# Patient Record
Sex: Female | Born: 1950 | Hispanic: No | Smoking: Current every day smoker
Health system: Southern US, Community
[De-identification: ages and names within clinical notes are randomized; demographics above are authoritative.]

---

## 2004-07-29 ENCOUNTER — Ambulatory Visit: Payer: Self-pay | Admitting: Obstetrics and Gynecology

## 2004-07-29 ENCOUNTER — Other Ambulatory Visit: Admission: RE | Admit: 2004-07-29 | Discharge: 2004-07-29 | Payer: Self-pay | Admitting: Obstetrics and Gynecology

## 2004-08-12 ENCOUNTER — Ambulatory Visit: Payer: Self-pay | Admitting: Obstetrics and Gynecology

## 2004-10-07 ENCOUNTER — Ambulatory Visit: Payer: Self-pay | Admitting: Obstetrics & Gynecology

## 2004-10-07 ENCOUNTER — Other Ambulatory Visit: Admission: RE | Admit: 2004-10-07 | Discharge: 2004-10-07 | Payer: Self-pay | Admitting: Obstetrics & Gynecology

## 2004-10-21 ENCOUNTER — Ambulatory Visit: Payer: Self-pay | Admitting: Obstetrics & Gynecology

## 2005-02-24 ENCOUNTER — Ambulatory Visit: Payer: Self-pay | Admitting: Obstetrics and Gynecology

## 2005-08-11 ENCOUNTER — Ambulatory Visit: Payer: Self-pay | Admitting: Obstetrics & Gynecology

## 2005-12-21 ENCOUNTER — Ambulatory Visit (HOSPITAL_COMMUNITY): Admission: RE | Admit: 2005-12-21 | Discharge: 2005-12-21 | Payer: Self-pay | Admitting: Obstetrics and Gynecology

## 2016-01-23 ENCOUNTER — Encounter (HOSPITAL_BASED_OUTPATIENT_CLINIC_OR_DEPARTMENT_OTHER): Payer: Self-pay | Admitting: *Deleted

## 2016-01-23 ENCOUNTER — Emergency Department (HOSPITAL_BASED_OUTPATIENT_CLINIC_OR_DEPARTMENT_OTHER): Payer: BLUE CROSS/BLUE SHIELD

## 2016-01-23 ENCOUNTER — Emergency Department (HOSPITAL_BASED_OUTPATIENT_CLINIC_OR_DEPARTMENT_OTHER)
Admission: EM | Admit: 2016-01-23 | Discharge: 2016-01-23 | Disposition: A | Discharge status: Home / Self Care | Payer: BLUE CROSS/BLUE SHIELD | Attending: Emergency Medicine | Admitting: Emergency Medicine

## 2016-01-23 DIAGNOSIS — J069 Acute upper respiratory infection, unspecified: Secondary | ICD-10-CM | POA: Insufficient documentation

## 2016-01-23 DIAGNOSIS — F17200 Nicotine dependence, unspecified, uncomplicated: Secondary | ICD-10-CM | POA: Insufficient documentation

## 2016-01-23 DIAGNOSIS — H9201 Otalgia, right ear: Secondary | ICD-10-CM | POA: Diagnosis present

## 2016-01-23 MED ORDER — ALBUTEROL SULFATE HFA 108 (90 BASE) MCG/ACT IN AERS: 1.0000 | INHALATION_SPRAY | Freq: Four times a day (QID) | RESPIRATORY_TRACT | 0 refills | Status: AC | PRN

## 2016-01-23 NOTE — ED Notes (Signed)
EDPA at [facility], pt alert, NAD, calm, interactive, resps e/u, speaking in clear complete sentences, pt seen by EDPA prior to RN assessment, see PA notes, pending orders.

## 2016-01-23 NOTE — Discharge Instructions (Signed)
Please read and follow all provided instructions.  Your diagnoses today include:  1. URI (upper respiratory infection)    Tests performed today include: Vital signs. See below for your results today.   Medications prescribed:  Take as prescribed   Home care instructions:  Follow any educational materials contained in this packet.  Follow-up instructions: Please follow-up with your primary care provider for further evaluation of symptoms and treatment   Return instructions:  Please return to the Emergency Department if you do not get better, if you get worse, or new symptoms OR  - Fever (temperature greater than 101.68F)  - Bleeding that does not stop with holding pressure to the area    -Severe pain (please note that you may be more sore the day after your accident)  - Chest Pain  - Difficulty breathing  - Severe nausea or vomiting  - Inability to tolerate food and liquids  - Passing out  - Skin becoming red around your wounds  - Change in mental status (confusion or lethargy)  - New numbness or weakness    Please return if you have any other emergent concerns.  Additional Information:  Your vital signs today were: BP 136/81 (BP Location: Left Arm)    Pulse 92    Temp 98.2 F (36.8 C) (Oral)    Resp 18    Ht 5\' 6"  (1.676 m)    Wt 61.5 kg    SpO2 95%    BMI 21.89 kg/m  If your blood pressure (BP) was elevated above 135/85 this visit, please have this repeated by your doctor within one month. ---------------

## 2016-01-23 NOTE — ED Provider Notes (Signed)
[facility] Note   CSN: [id number] Arrival date & time: 01/23/16  1805  By signing my name below, I, Phillis Haggis, attest that this documentation has been prepared under the direction and in the presence of Audry Pili, PA-C. Electronically Signed: Phillis Haggis, ED Scribe. 01/23/16. 8:14 PM.  First Provider Contact:  First MD Initiated Contact with Patient 01/23/16 1935     History   Chief Complaint Chief Complaint  Patient presents with  . Otalgia   The history is provided by the patient. No language interpreter was used.  HPI Comments: Sierra Kelley is a 65 y.o. female who presents to the Emergency Department complaining of sharp right otalgia onset 3 days ago. She reports an associated "knot" behind the ear, productive cough with sputum, and rhinorrhea. Pt states that she has had a sinus infection for the past 2 weeks and has been using OTC medications including Mucinex DM and Sudafed to no relief. Pt is a smoker. She denies fever, chills, sinus pressure, or ear discharge. No CP/SOB. No other symptoms noted.    History reviewed. No pertinent past medical history.  There are no active problems to display for this patient.  History reviewed. No pertinent surgical history.  OB History    No data available     Home Medications    Prior to Admission medications   Not on File   Family History No family history on file.  Social History Social History  Substance Use Topics  . Smoking status: Current Every Day Smoker  . Smokeless tobacco: Never Used  . Alcohol use No    Allergies   Review of patient's allergies indicates no known allergies.   Review of Systems Review of Systems  Constitutional: Negative for chills and fever.  HENT: Positive for ear pain and rhinorrhea. Negative for ear discharge and sinus pressure.   Respiratory: Positive for cough and shortness of breath.    Physical Exam Updated Vital Signs BP 136/81 (BP Location: Left  Arm)   Pulse 92   Temp 98.2 F (36.8 C) (Oral)   Resp 18   Ht  (1.676 m)   Wt 135 lb 9.6 oz (61.5 kg)   SpO2 95%   BMI 21.89 kg/m   Physical Exam  Constitutional: She is oriented to person, place, and time. She appears well-developed and well-nourished.  HENT:  Head: Normocephalic and atraumatic.  Right Ear: Tympanic membrane normal. No mastoid tenderness.  Left Ear: Tympanic membrane normal. No mastoid tenderness.  Mouth/Throat: Uvula is midline, oropharynx is clear and moist and mucous membranes are normal. No oropharyngeal exudate, posterior oropharyngeal edema, posterior oropharyngeal erythema or tonsillar abscesses.  Eyes: Conjunctivae and EOM are normal. Pupils are equal, round, and reactive to light.  Neck: Normal range of motion. Neck supple.  Pulmonary/Chest: She has wheezes in the right upper field.  Musculoskeletal: Normal range of motion.  Neurological: She is alert and oriented to person, place, and time.  Skin: Skin is warm and dry.  Psychiatric: She has a normal mood and affect. Her behavior is normal.  Nursing note and vitals reviewed.  ED Treatments / Results  DIAGNOSTIC STUDIES: Oxygen Saturation is 95% on RA, adequate by my interpretation.    COORDINATION OF CARE: 8:12 PM-Discussed treatment plan which includes chest x-ray with pt at bedside and pt agreed to plan.   Labs (all labs ordered are listed, but only abnormal results are displayed) Labs Reviewed - No data to display  EKG  EKG Interpretation  None      Radiology Dg Chest 2 View  Result Date: 01/23/2016 CLINICAL DATA:  Cough for 2 weeks, initial encounter EXAM: CHEST  2 VIEW COMPARISON:  None. FINDINGS: Cardiac shadow is within normal limits. The lungs are hyperinflated consistent with COPD. No focal infiltrate or sizable effusion is seen. No bony or soft tissue abnormality is noted. IMPRESSION: COPD without acute abnormality. Electronically Signed   By: Alcide Clever M.D.   On: 01/23/2016  20:35  Procedures Procedures (including critical care time)  Medications Ordered in ED Medications - No data to display   Initial Impression / Assessment and Plan / ED Course  I have reviewed the triage vital signs and the nursing notes.  Pertinent labs & imaging results that were available during my care of the patient were reviewed by me and considered in my medical decision making (see chart for details).  Clinical Course    Final Clinical Impressions(s) / ED Diagnoses  I have reviewed and evaluated the relevant imaging studies.  I have reviewed the relevant previous healthcare records. I obtained HPI from historian.  ED Course:  Assessment: Pt is a 65yF presents with otalgia and cough . On exam, pt in NAD. VSS. Afebrile. Lungs CTA, Heart RRR. Abdomen nontender/soft. Pt CXR negative for acute infiltrate. Pt does have COPD noted on CXR. Patients symptoms are consistent with URI, likely viral etiology. Discussed that antibiotics are not indicated for viral infections. Pt will be discharged with symptomatic treatment.  Given Albuterol Inhaler Rx due to wheezing. Verbalizes understanding and is agreeable with plan. Pt is hemodynamically stable & in NAD prior to dc  Disposition/Plan:  DC Home Additional Verbal discharge instructions given and discussed with patient.  Pt Instructed to f/u with PCP in the next week for evaluation and treatment of symptoms. Return precautions given Pt acknowledges and agrees with plan  Supervising Physician Melene Plan, DO   Final diagnoses:  URI (upper respiratory infection)    New Prescriptions New Prescriptions   No medications on file     Audry Pili, PA-C 01/23/16 2109    Melene Plan, DO 01/23/16 2225

## 2016-01-23 NOTE — ED Triage Notes (Signed)
States she had a sinus infection 2 weeks ago. She used OTC meds for symptoms. She now has pain in her right ear and a knot behind her ear.

## 2017-03-16 IMAGING — CR DG CHEST 2V
2 series · 2 of 2 positions shown · non-contrast
Comparison: None.

CLINICAL DATA: Cough for 2 weeks, initial encounter

EXAM:
CHEST  2 VIEW

[w chest pa]
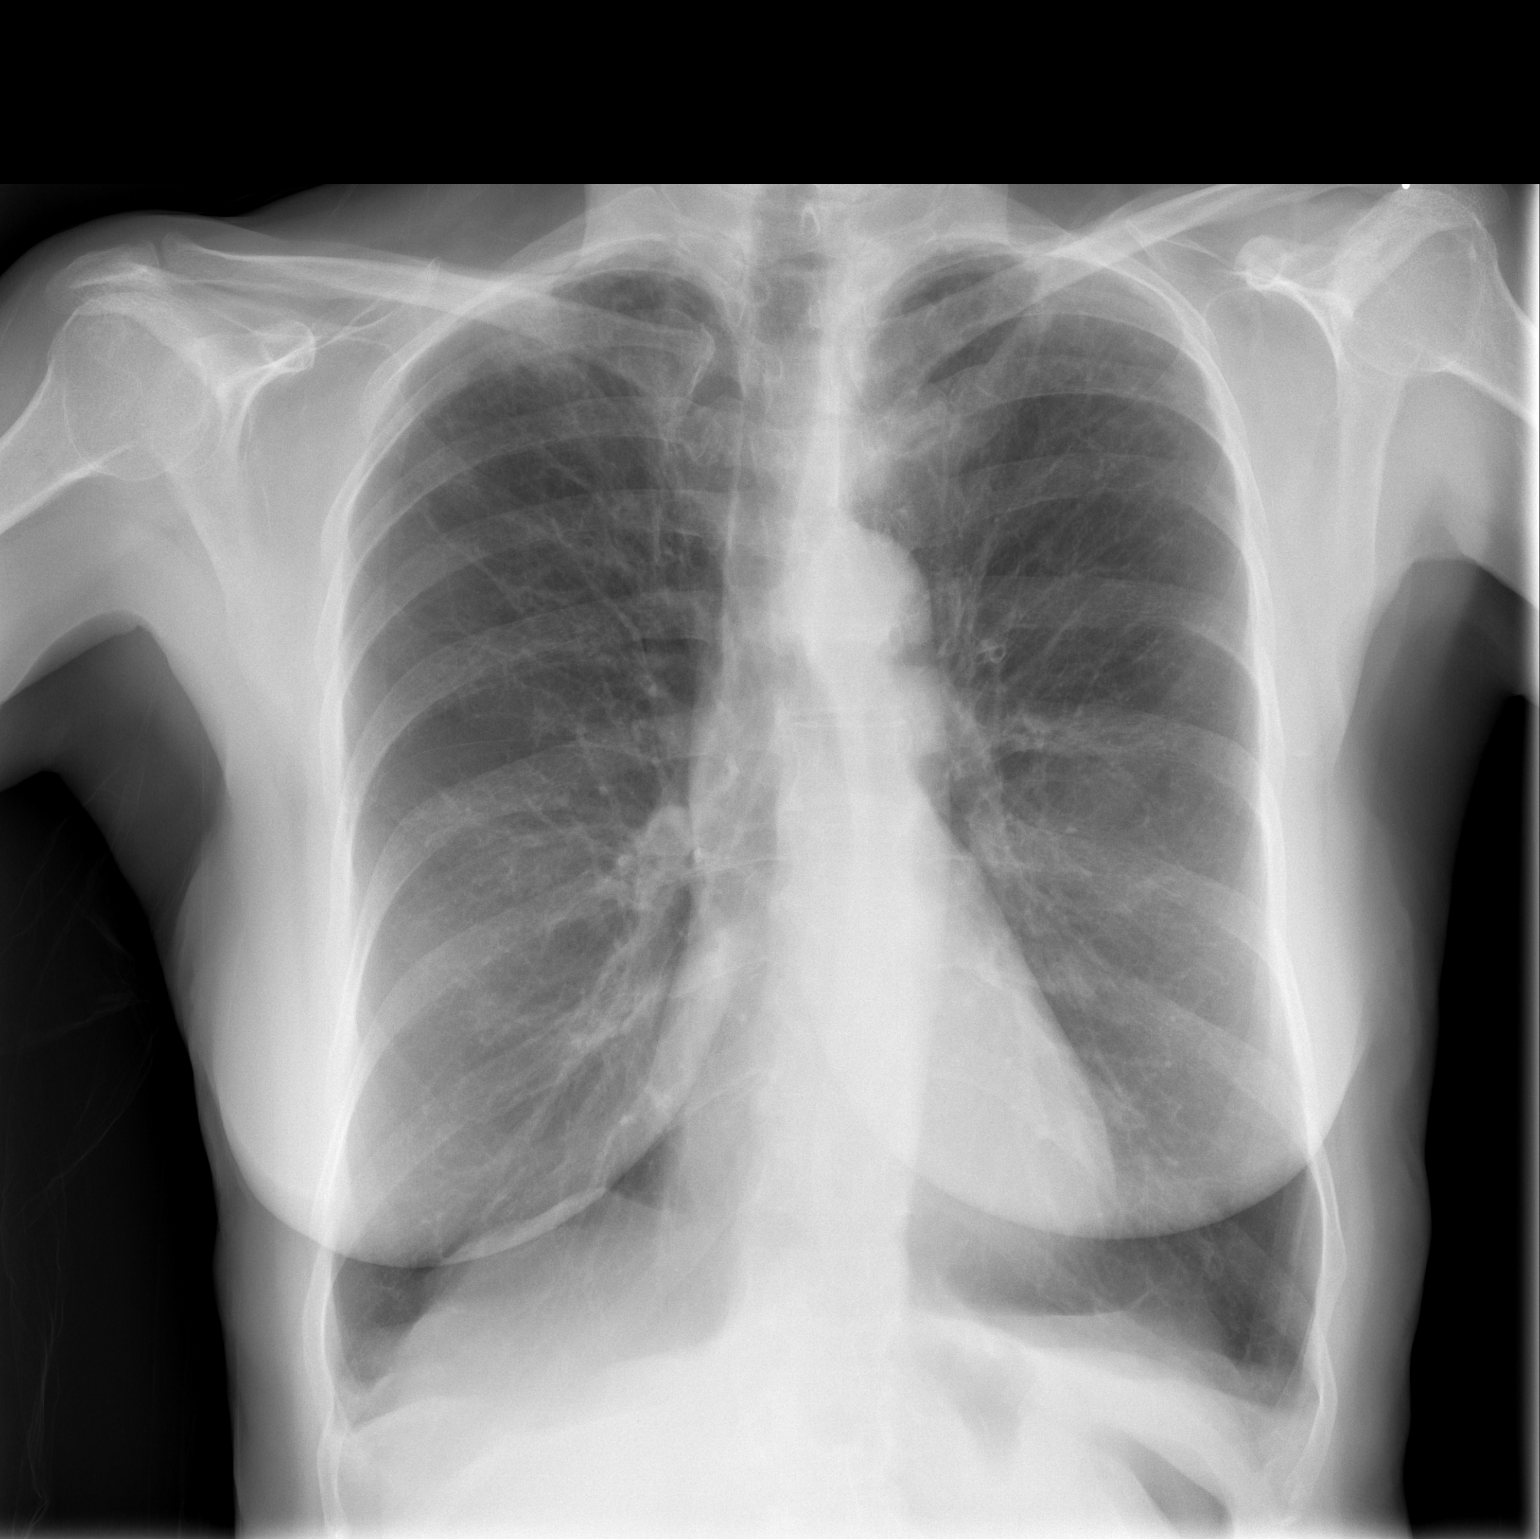

[w chest lat]
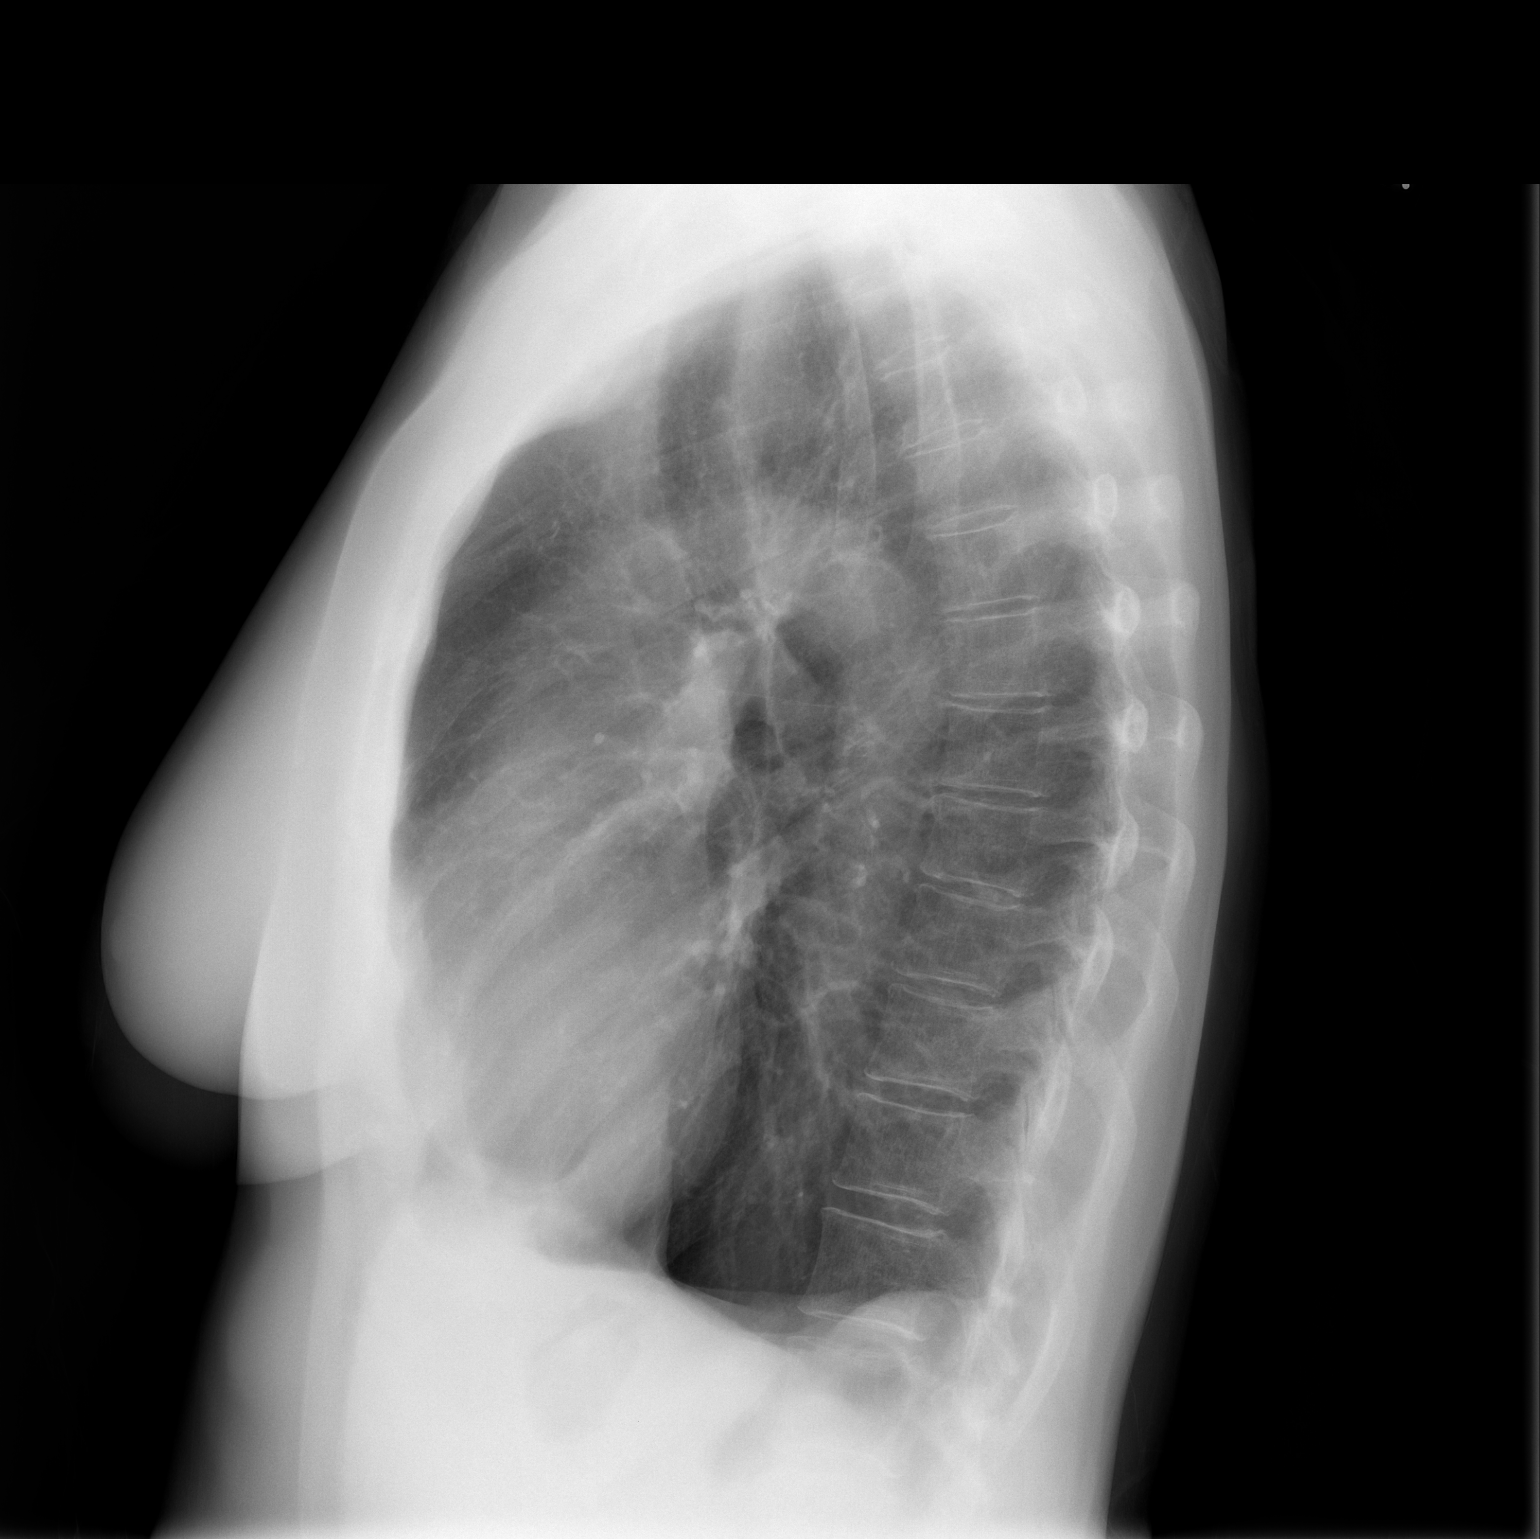

[2 of 2 positions shown; findings below may reference images not displayed]

FINDINGS: Cardiac shadow is within normal limits. The lungs are hyperinflated
consistent with COPD. No focal infiltrate or sizable effusion is
seen. No bony or soft tissue abnormality is noted.
IMPRESSION: COPD without acute abnormality.
